# Patient Record
Sex: Male | Born: 1955 | Race: Black or African American | Hispanic: No | Marital: Married | Smoking: Former smoker
Health system: Southern US, Community
[De-identification: ages and names within clinical notes are randomized; demographics above are authoritative.]

## PROBLEM LIST (undated history)

## (undated) DIAGNOSIS — I10 Essential (primary) hypertension: Secondary | ICD-10-CM

## (undated) DIAGNOSIS — J939 Pneumothorax, unspecified: Secondary | ICD-10-CM

## (undated) HISTORY — PX: LUNG SURGERY: SHX703

---

## 2018-10-21 ENCOUNTER — Emergency Department (HOSPITAL_BASED_OUTPATIENT_CLINIC_OR_DEPARTMENT_OTHER)
Admission: EM | Admit: 2018-10-21 | Discharge: 2018-10-21 | Disposition: A | Discharge status: Home / Self Care | Payer: Medicare Other | Attending: Emergency Medicine | Admitting: Emergency Medicine

## 2018-10-21 ENCOUNTER — Encounter (HOSPITAL_BASED_OUTPATIENT_CLINIC_OR_DEPARTMENT_OTHER): Payer: Self-pay

## 2018-10-21 ENCOUNTER — Emergency Department (HOSPITAL_BASED_OUTPATIENT_CLINIC_OR_DEPARTMENT_OTHER): Payer: Medicare Other

## 2018-10-21 ENCOUNTER — Other Ambulatory Visit: Payer: Self-pay

## 2018-10-21 DIAGNOSIS — R091 Pleurisy: Secondary | ICD-10-CM | POA: Diagnosis not present

## 2018-10-21 DIAGNOSIS — R079 Chest pain, unspecified: Secondary | ICD-10-CM | POA: Diagnosis present

## 2018-10-21 DIAGNOSIS — Z87891 Personal history of nicotine dependence: Secondary | ICD-10-CM | POA: Insufficient documentation

## 2018-10-21 DIAGNOSIS — I10 Essential (primary) hypertension: Secondary | ICD-10-CM | POA: Insufficient documentation

## 2018-10-21 HISTORY — DX: Essential (primary) hypertension: I10

## 2018-10-21 HISTORY — DX: Pneumothorax, unspecified: J93.9

## 2018-10-21 MED ORDER — BENZONATATE 100 MG PO CAPS: 100.0000 mg | ORAL_CAPSULE | Freq: Three times a day (TID) | ORAL | 0 refills | Status: AC

## 2018-10-21 MED FILL — BENZONATATE 100 MG CAPS: 100 | 7 days supply | Qty: 21 | Fill #0

## 2018-10-21 NOTE — Discharge Instructions (Signed)
Take tylenol 2 pills 4 times a day and motrin 4 pills 3 times a day.  Drink plenty of fluids.  Return for worsening shortness of breath, headache, confusion. Follow up with your family doctor.   

## 2018-10-21 NOTE — ED Provider Notes (Signed)
[facility] EMERGENCY DEPARTMENT Provider Note   CSN: [id number] Arrival date & time: 10/21/18  1544     History   Chief Complaint Chief Complaint  Patient presents with  . Cough  . Chest Pain    HPI Joseeduardo Brix is a 63 y.o. male.     63 yo M with a cc of left sided chest pain.  This been going on for couple days.  He states that he feels like he is come down with a cold he has been coughing and congested.  Pain is left-sided and sharp.  It feels like the times previously when his lung had spontaneously collapsed.  That happened to him 3 times on the left and twice on the right.  He has had a VATS to both sides.  Has not had no spontaneous pneumothorax since then.  He denies history of PE or DVT denies hemoptysis denies unilateral lower extremity edema denies recent surgery immobilization or testosterone use.  The history is provided by the patient.  Cough Associated symptoms: chest pain   Associated symptoms: no chills, no eye discharge, no fever, no headaches, no myalgias, no rash and no shortness of breath   Chest Pain Associated symptoms: cough   Associated symptoms: no abdominal pain, no fever, no headache, no palpitations, no shortness of breath and no vomiting   Illness Severity:  Moderate Onset quality:  Gradual Duration:  2 days Timing:  Constant Progression:  Worsening Chronicity:  New Associated symptoms: chest pain and cough   Associated symptoms: no abdominal pain, no congestion, no diarrhea, no fever, no headaches, no myalgias, no rash, no shortness of breath and no vomiting     Past Medical History:  Diagnosis Date  . Hypertension   . Pneumothorax     There are no active problems to display for this patient.   Past Surgical History:  Procedure Laterality Date  . LUNG SURGERY          Home Medications    Prior to Admission medications   Medication Sig Start Date End Date Taking? Authorizing Provider  benzonatate (TESSALON) 100 MG  capsule Take 1 capsule (100 mg total) by mouth every 8 (eight) hours. 10/21/18   Melene Plan, DO    Family History No family history on file.  Social History Social History   Tobacco Use  . Smoking status: Former Games developer  . Smokeless tobacco: Never Used  Substance Use Topics  . Alcohol use: Never    Frequency: Never  . Drug use: Never     Allergies   Dilaudid [hydromorphone hcl]   Review of Systems Review of Systems  Constitutional: Negative for chills and fever.  HENT: Negative for congestion and facial swelling.   Eyes: Negative for discharge and visual disturbance.  Respiratory: Positive for cough. Negative for shortness of breath.   Cardiovascular: Positive for chest pain. Negative for palpitations.  Gastrointestinal: Negative for abdominal pain, diarrhea and vomiting.  Musculoskeletal: Negative for arthralgias and myalgias.  Skin: Negative for color change and rash.  Neurological: Negative for tremors, syncope and headaches.  Psychiatric/Behavioral: Negative for confusion and dysphoric mood.     Physical Exam Updated Vital Signs BP (!) 175/77 (BP Location: Right Arm)   Pulse 70   Temp 98.5 F (36.9 C) (Oral)   Resp 20   Ht 5\' 8"  (1.727 m)   Wt 93.4 kg   SpO2 99%   BMI 31.32 kg/m   Physical Exam Vitals signs and nursing note reviewed.  Constitutional:      Appearance: He is well-developed.  HENT:     Head: Normocephalic and atraumatic.  Eyes:     Pupils: Pupils are equal, round, and reactive to light.  Neck:     Musculoskeletal: Normal range of motion and neck supple.     Vascular: No JVD.  Cardiovascular:     Rate and Rhythm: Normal rate and regular rhythm.     Heart sounds: No murmur. No friction rub. No gallop.   Pulmonary:     Effort: No respiratory distress.     Breath sounds: No wheezing.  Chest:     Chest wall: Tenderness (reproduces the patients symptoms) present.  Abdominal:     General: There is no distension.     Tenderness: There is  no guarding or rebound.  Musculoskeletal: Normal range of motion.  Skin:    Coloration: Skin is not pale.     Findings: No rash.  Neurological:     Mental Status: He is alert and oriented to person, place, and time.  Psychiatric:        Behavior: Behavior normal.      ED Treatments / Results  Labs (all labs ordered are listed, but only abnormal results are displayed) Labs Reviewed - No data to display  EKG EKG Interpretation  Date/Time:  Thursday October 21 2018 16:01:45 EDT Ventricular Rate:  75 PR Interval:  136 QRS Duration: 92 QT Interval:  386 QTC Calculation: 431 R Axis:   48 Text Interpretation:  Normal sinus rhythm Normal ECG No old tracing to compare Confirmed by Deno Etienne [id number]) on 10/21/2018 4:45:22 PM   Radiology Dg Chest Portable 1 View  Result Date: 10/21/2018 CLINICAL DATA:  Cough and shortness of breath for 3 days. EXAM: PORTABLE CHEST 1 VIEW COMPARISON:  None. FINDINGS: The heart size and mediastinal contours are within normal limits. Both lungs are clear. Minimal scar is identified in bilateral lung bases. The visualized skeletal structures are unremarkable. IMPRESSION: No active cardiopulmonary disease. Electronically Signed   By: Abelardo Diesel M.D.   On: 10/21/2018 16:27    Procedures Procedures (including critical care time)  Medications Ordered in ED Medications - No data to display   Initial Impression / Assessment and Plan / ED Course  I have reviewed the triage vital signs and the nursing notes.  Pertinent labs & imaging results that were available during my care of the patient were reviewed by me and considered in my medical decision making (see chart for details).        63 yo M with a cc of left sided chest pain.  Going on for the past couple days, coinciding with uri symptoms.  Well appearing, non toxic.  CXR viewed by me without ptx or pna.  Patient likely with pleurisy or chest wall pain.  Treat with cough meds, tylenol and  ibuprofen.  PCP follow up.   4:57 PM:  I have discussed the diagnosis/risks/treatment options with the patient and believe the pt to be eligible for discharge home to follow-up with PCP. We also discussed returning to the ED immediately if new or worsening sx occur. We discussed the sx which are most concerning (e.g., sudden worsening pain, fever, inability to tolerate by mouth) that necessitate immediate return. Medications administered to the patient during their visit and any new prescriptions provided to the patient are listed below.  Medications given during this visit Medications - No data to display   The patient appears reasonably screen and/or  stabilized for discharge and I doubt any other medical condition or other [facility] requiring further screening, evaluation, or treatment in the ED at this time prior to discharge.    Final Clinical Impressions(s) / ED Diagnoses   Final diagnoses:  Pleurisy    ED Discharge Orders         Ordered    benzonatate (TESSALON) 100 MG capsule  Every 8 hours     10/21/18 1654           Melene PlanFloyd, Wynton Hufstetler, DO 10/21/18 1657

## 2018-10-21 NOTE — ED Triage Notes (Addendum)
Pt c/o flu like sx day 3 with left side PC and SOB-states he feels same as when he had a collapsed lung in the past-NAD-steady gait

## 2018-10-21 NOTE — ED Notes (Addendum)
Pt reports bad coughing spell x a few days with body aches. Pt has hx of pneumothroax and concerned. Pt denies pain with deep breathing.

## 2020-06-29 IMAGING — DX DG CHEST 1V PORT
1 series · 1 of 1 positions shown · non-contrast
Comparison: None.

CLINICAL DATA: Cough and shortness of breath for 3 days.

EXAM:
PORTABLE CHEST 1 VIEW

[chest ap]
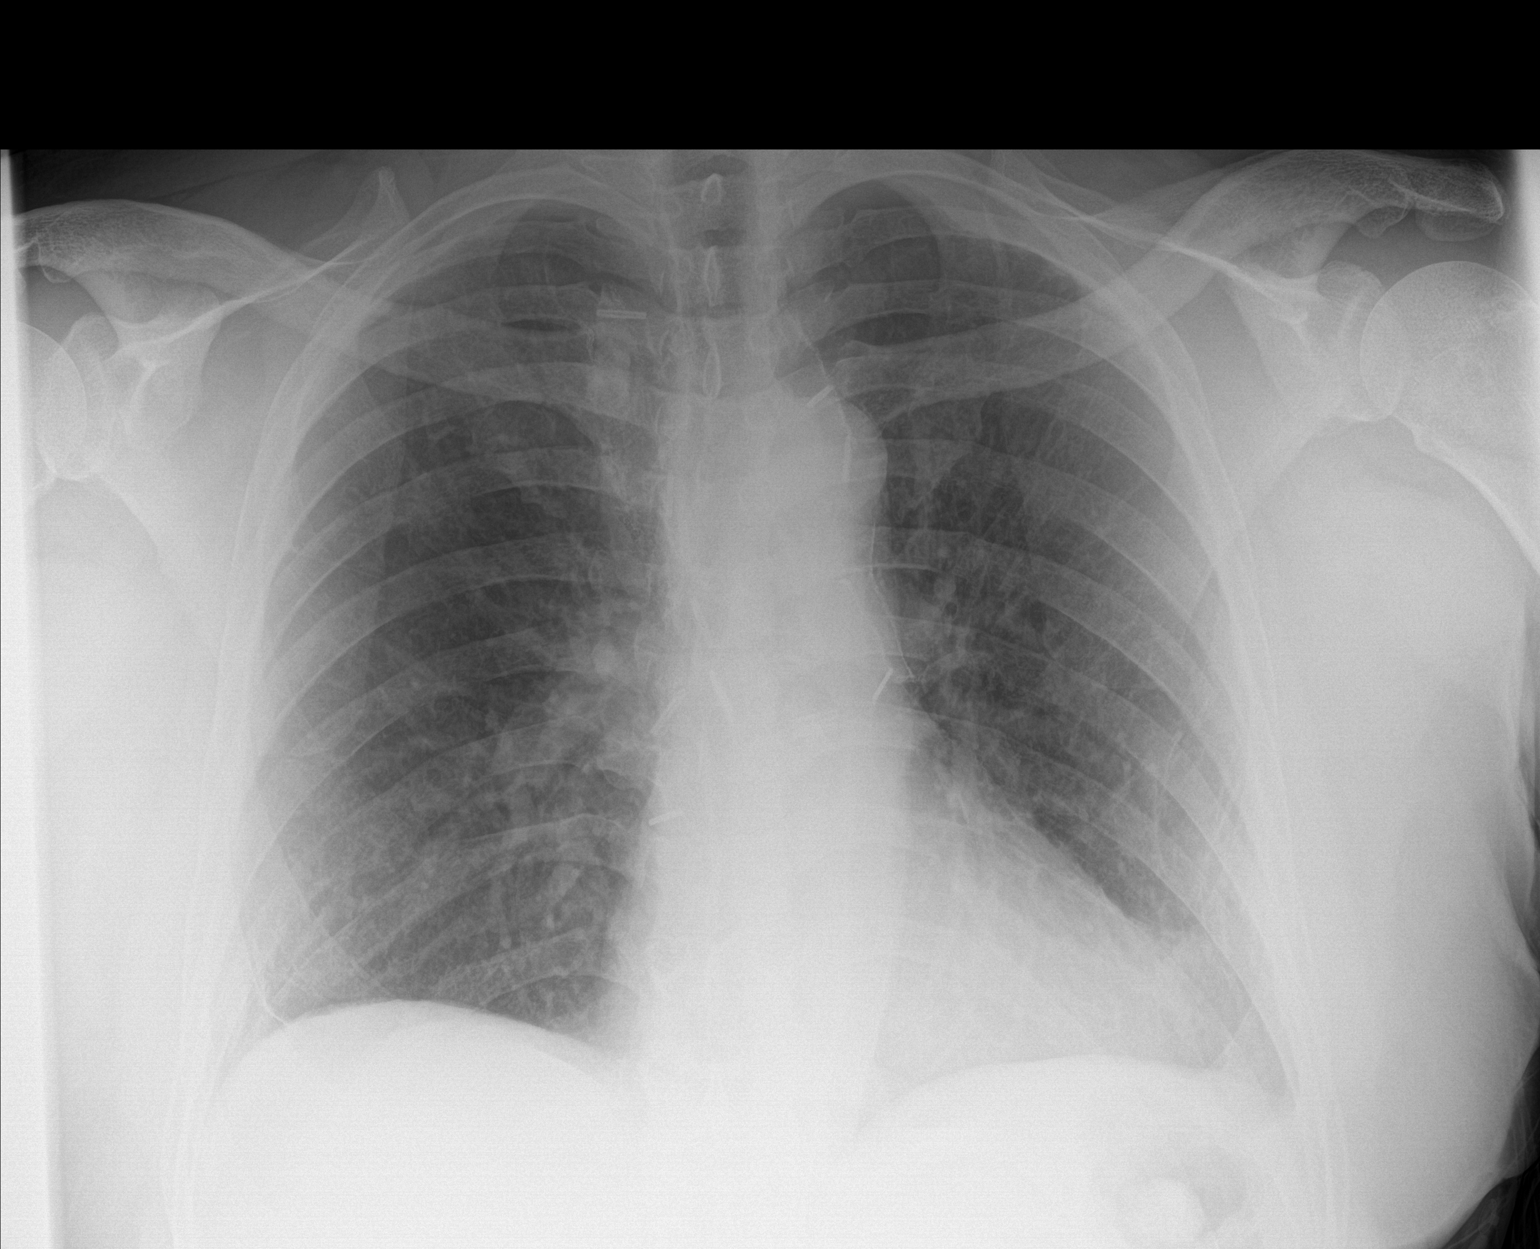

[1 of 1 positions shown; findings below may reference images not displayed]

FINDINGS: The heart size and mediastinal contours are within normal limits.
Both lungs are clear. Minimal scar is identified in bilateral lung
bases. The visualized skeletal structures are unremarkable.
IMPRESSION: No active cardiopulmonary disease.

## 2022-02-07 ENCOUNTER — Other Ambulatory Visit (HOSPITAL_BASED_OUTPATIENT_CLINIC_OR_DEPARTMENT_OTHER): Payer: Self-pay

## 2022-02-07 MED ORDER — OXYCODONE-ACETAMINOPHEN 10-325 MG PO TABS
1.0000 | ORAL_TABLET | Freq: Four times a day (QID) | ORAL | 0 refills | Status: DC | PRN
  Filled 2022-02-07: qty 120, 30d supply, fill #0

## 2022-02-11 ENCOUNTER — Other Ambulatory Visit (HOSPITAL_BASED_OUTPATIENT_CLINIC_OR_DEPARTMENT_OTHER): Payer: Self-pay

## 2022-03-12 ENCOUNTER — Other Ambulatory Visit (HOSPITAL_BASED_OUTPATIENT_CLINIC_OR_DEPARTMENT_OTHER): Payer: Self-pay

## 2022-03-12 MED ORDER — HYDROXYZINE HCL 25 MG PO TABS
25.0000 mg | ORAL_TABLET | Freq: Every evening | ORAL | 2 refills | Status: AC | PRN
  Filled 2022-03-12: qty 60, 30d supply, fill #0

## 2022-03-12 MED ORDER — OXYCODONE-ACETAMINOPHEN 10-325 MG PO TABS
1.0000 | ORAL_TABLET | Freq: Four times a day (QID) | ORAL | 0 refills | Status: AC | PRN
  Filled 2022-03-12: qty 120, 30d supply, fill #0

## 2022-07-04 ENCOUNTER — Other Ambulatory Visit: Payer: Self-pay

## 2022-07-07 ENCOUNTER — Other Ambulatory Visit (HOSPITAL_BASED_OUTPATIENT_CLINIC_OR_DEPARTMENT_OTHER): Payer: Self-pay

## 2022-07-08 ENCOUNTER — Other Ambulatory Visit (HOSPITAL_BASED_OUTPATIENT_CLINIC_OR_DEPARTMENT_OTHER): Payer: Self-pay
